# Patient Record
Sex: Male | Born: 1980 | Race: White | Hispanic: No | Marital: Single | Smoking: Former smoker
Health system: Southern US, Community
[De-identification: ages and names within clinical notes are randomized; demographics above are authoritative.]

---

## 2007-08-17 ENCOUNTER — Emergency Department: Payer: Self-pay | Admitting: Emergency Medicine

## 2009-03-25 IMAGING — CR RIGHT ANKLE - COMPLETE 3+ VIEW
1 series · 5 of 5 positions shown · non-contrast
Comparison: none

REASON FOR EXAM: Trauma, pain, swelling
COMMENTS:

[Series 1: view not recorded · 0.17mm/px · 5 of 5 slices shown]
[im 1/5]
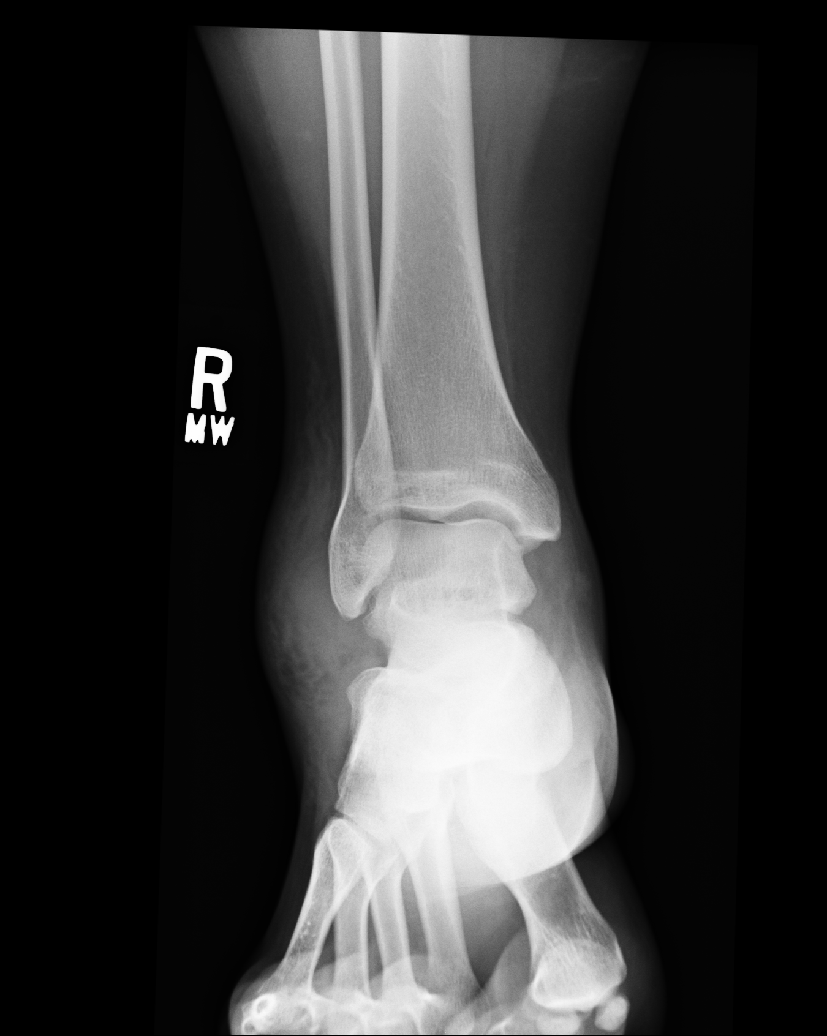
[im 2/5]
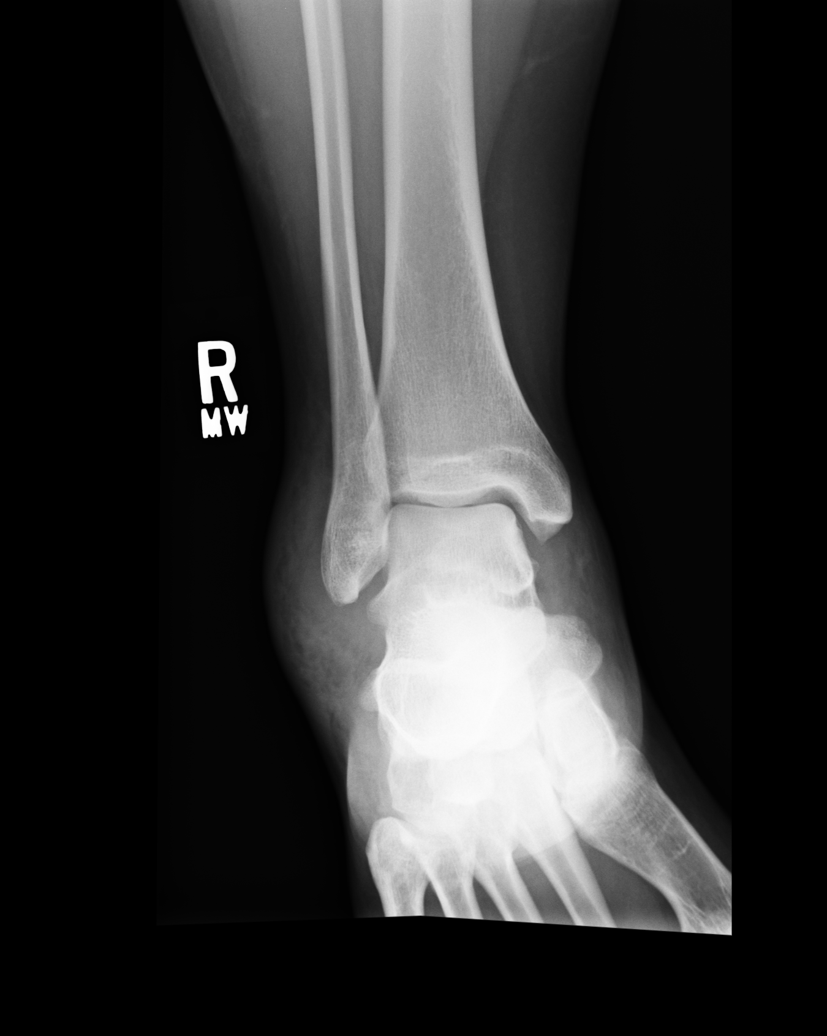
[im 3/5]
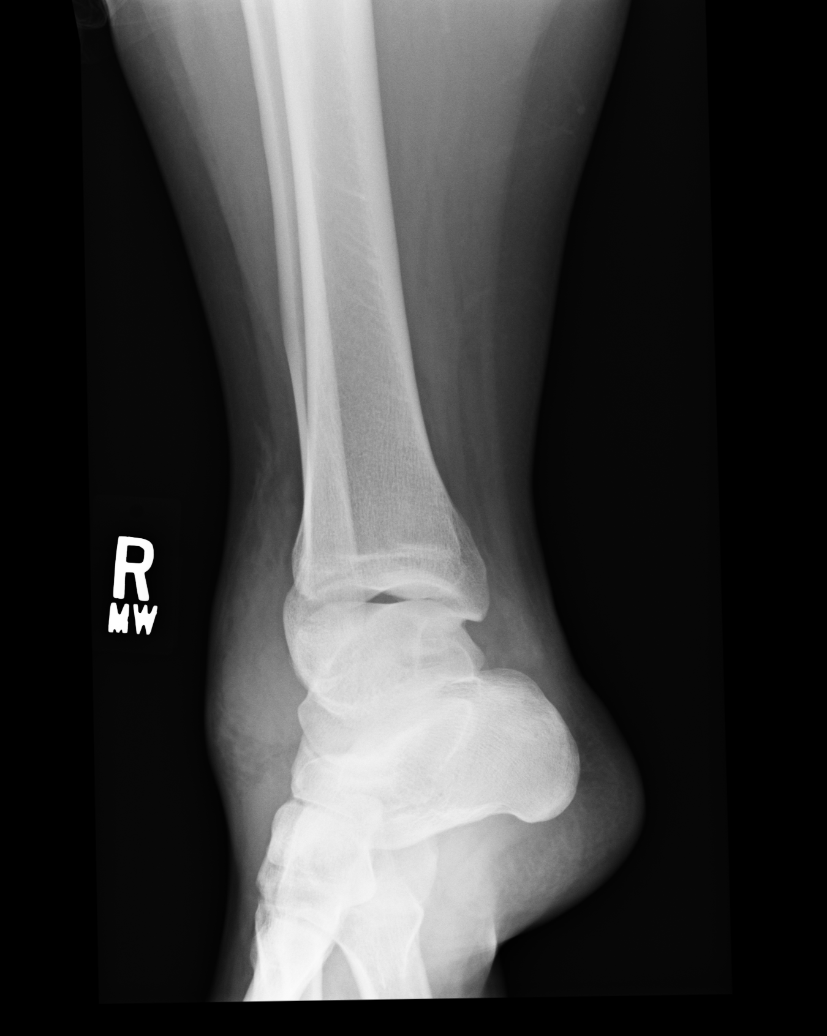
[im 4/5]
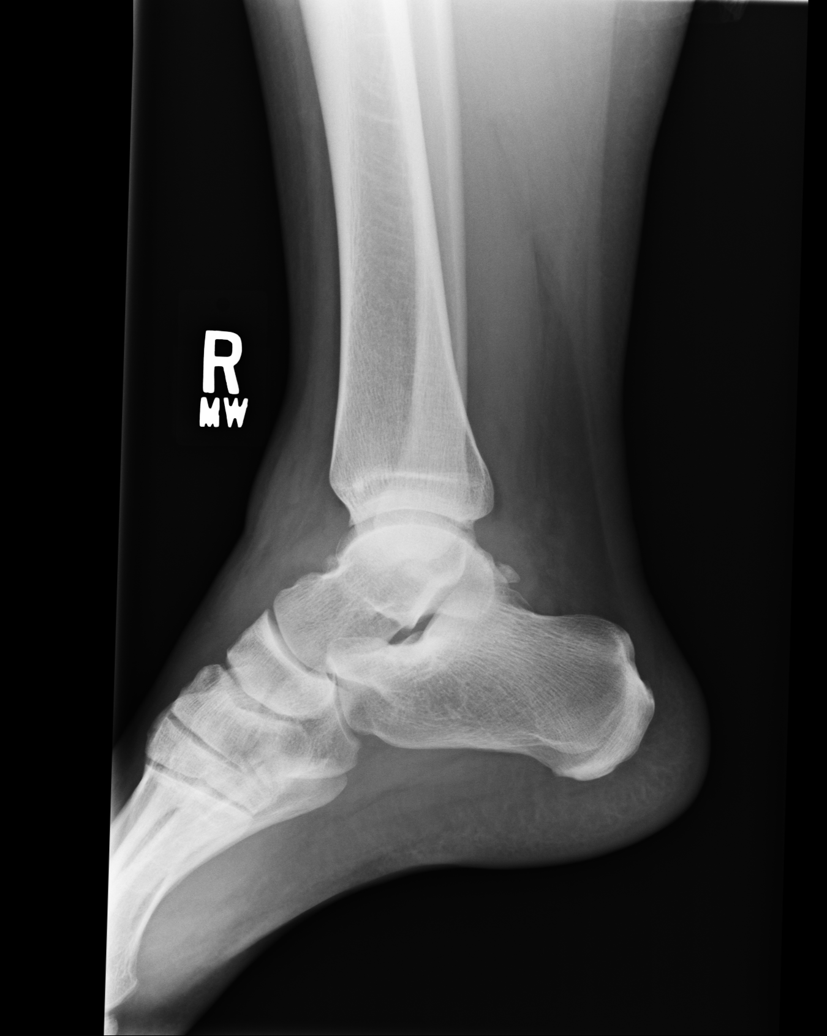
[im 5/5]
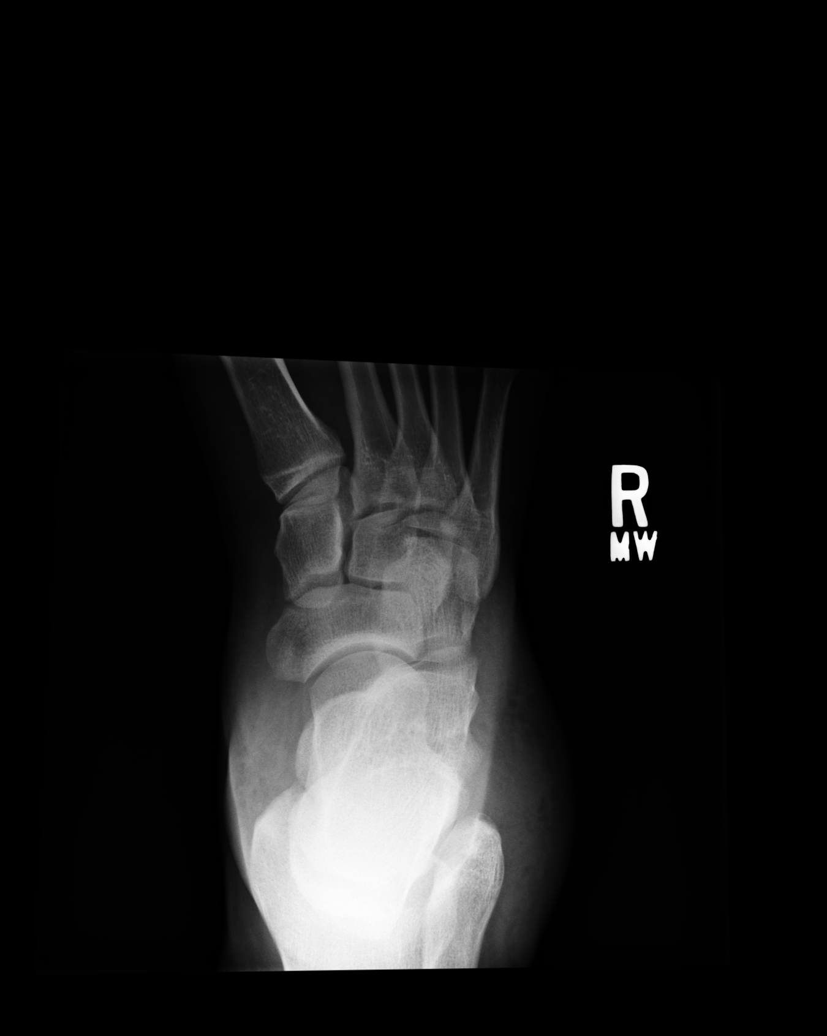

[5 of 5 positions shown; findings below may reference images not displayed]

PROCEDURE:     DXR - DXR ANKLE RIGHT COMPLETE  - August 17, 2007  [DATE]

RESULT:     Soft tissue swelling is appreciated involving the lateral
malleolar region. No radiographic evidence of overt fracture is appreciated.
A radiocult fracture cannot be excluded particularly considering the large
amount of soft tissue swelling.
IMPRESSION: Findings which appear to be consistent with ligamentous
injury within the lateral malleolar region. A radiocult fracture within the
fibula cannot be excluded particularly considering the large amount of soft
tissue swelling. Repeat evaluation in 7-10 days is recommended, if and as
clinically warranted.

## 2012-09-18 ENCOUNTER — Emergency Department: Payer: Self-pay | Admitting: Unknown Physician Specialty

## 2012-09-18 LAB — CBC
HCT: 42.8 % (ref 40.0–52.0)
HGB: 14.6 g/dL (ref 13.0–18.0)
MCH: 28.3 pg (ref 26.0–34.0)
MCV: 83 fL (ref 80–100)
Platelet: 138 10*3/uL — ABNORMAL LOW (ref 150–440)
RBC: 5.17 10*6/uL (ref 4.40–5.90)
RDW: 14.5 % (ref 11.5–14.5)
WBC: 8 10*3/uL (ref 3.8–10.6)

## 2012-09-18 LAB — COMPREHENSIVE METABOLIC PANEL
Anion Gap: 5 — ABNORMAL LOW (ref 7–16)
BUN: 7 mg/dL (ref 7–18)
Chloride: 106 mmol/L (ref 98–107)
Co2: 28 mmol/L (ref 21–32)
Creatinine: 0.83 mg/dL (ref 0.60–1.30)
EGFR (African American): >60
EGFR (Non-African Amer.): >60
Glucose: 92 mg/dL (ref 65–99)
Osmolality: 275 (ref 275–301)
Potassium: 3.8 mmol/L (ref 3.5–5.1)
SGPT (ALT): 125 U/L — ABNORMAL HIGH (ref 12–78)
Total Protein: 7.1 g/dL (ref 6.4–8.2)

## 2012-09-18 LAB — URINALYSIS, COMPLETE
Blood: NEGATIVE
Glucose,UR: NEGATIVE mg/dL (ref 0–75)
Leukocyte Esterase: NEGATIVE
Nitrite: NEGATIVE
Protein: 30
RBC,UR: 1 /HPF (ref 0–5)
Specific Gravity: 1.028 (ref 1.003–1.030)
Squamous Epithelial: NONE SEEN
WBC UR: 2 /HPF (ref 0–5)

## 2012-09-18 LAB — DIFFERENTIAL
Eosinophil #: 0.1 10*3/uL (ref 0.0–0.7)
Eosinophil %: 0.8 %
Lymphocyte #: 3.1 10*3/uL (ref 1.0–3.6)
Lymphocyte %: 38.6 %
Neutrophil %: 51.9 %

## 2012-09-24 LAB — CULTURE, BLOOD (SINGLE)

## 2014-04-27 IMAGING — CR DG CHEST 2V
1 series · 2 of 2 positions shown · non-contrast
Comparison: none

REASON FOR EXAM: cough fever
COMMENTS:   May transport without cardiac monitor

PROCEDURE:     DXR - DXR CHEST PA (OR AP) AND LATERAL  - September 18, 2012  [DATE]
RESULT:     The lungs are clear. The heart and pulmonary vessels are normal.
The bony and mediastinal structures are unremarkable. There is no effusion.
There is no pneumothorax or evidence of congestive failure.

[Series 1: w chest pa · 0.14mm/px · 2 of 2 slices shown]
[im 1/2]
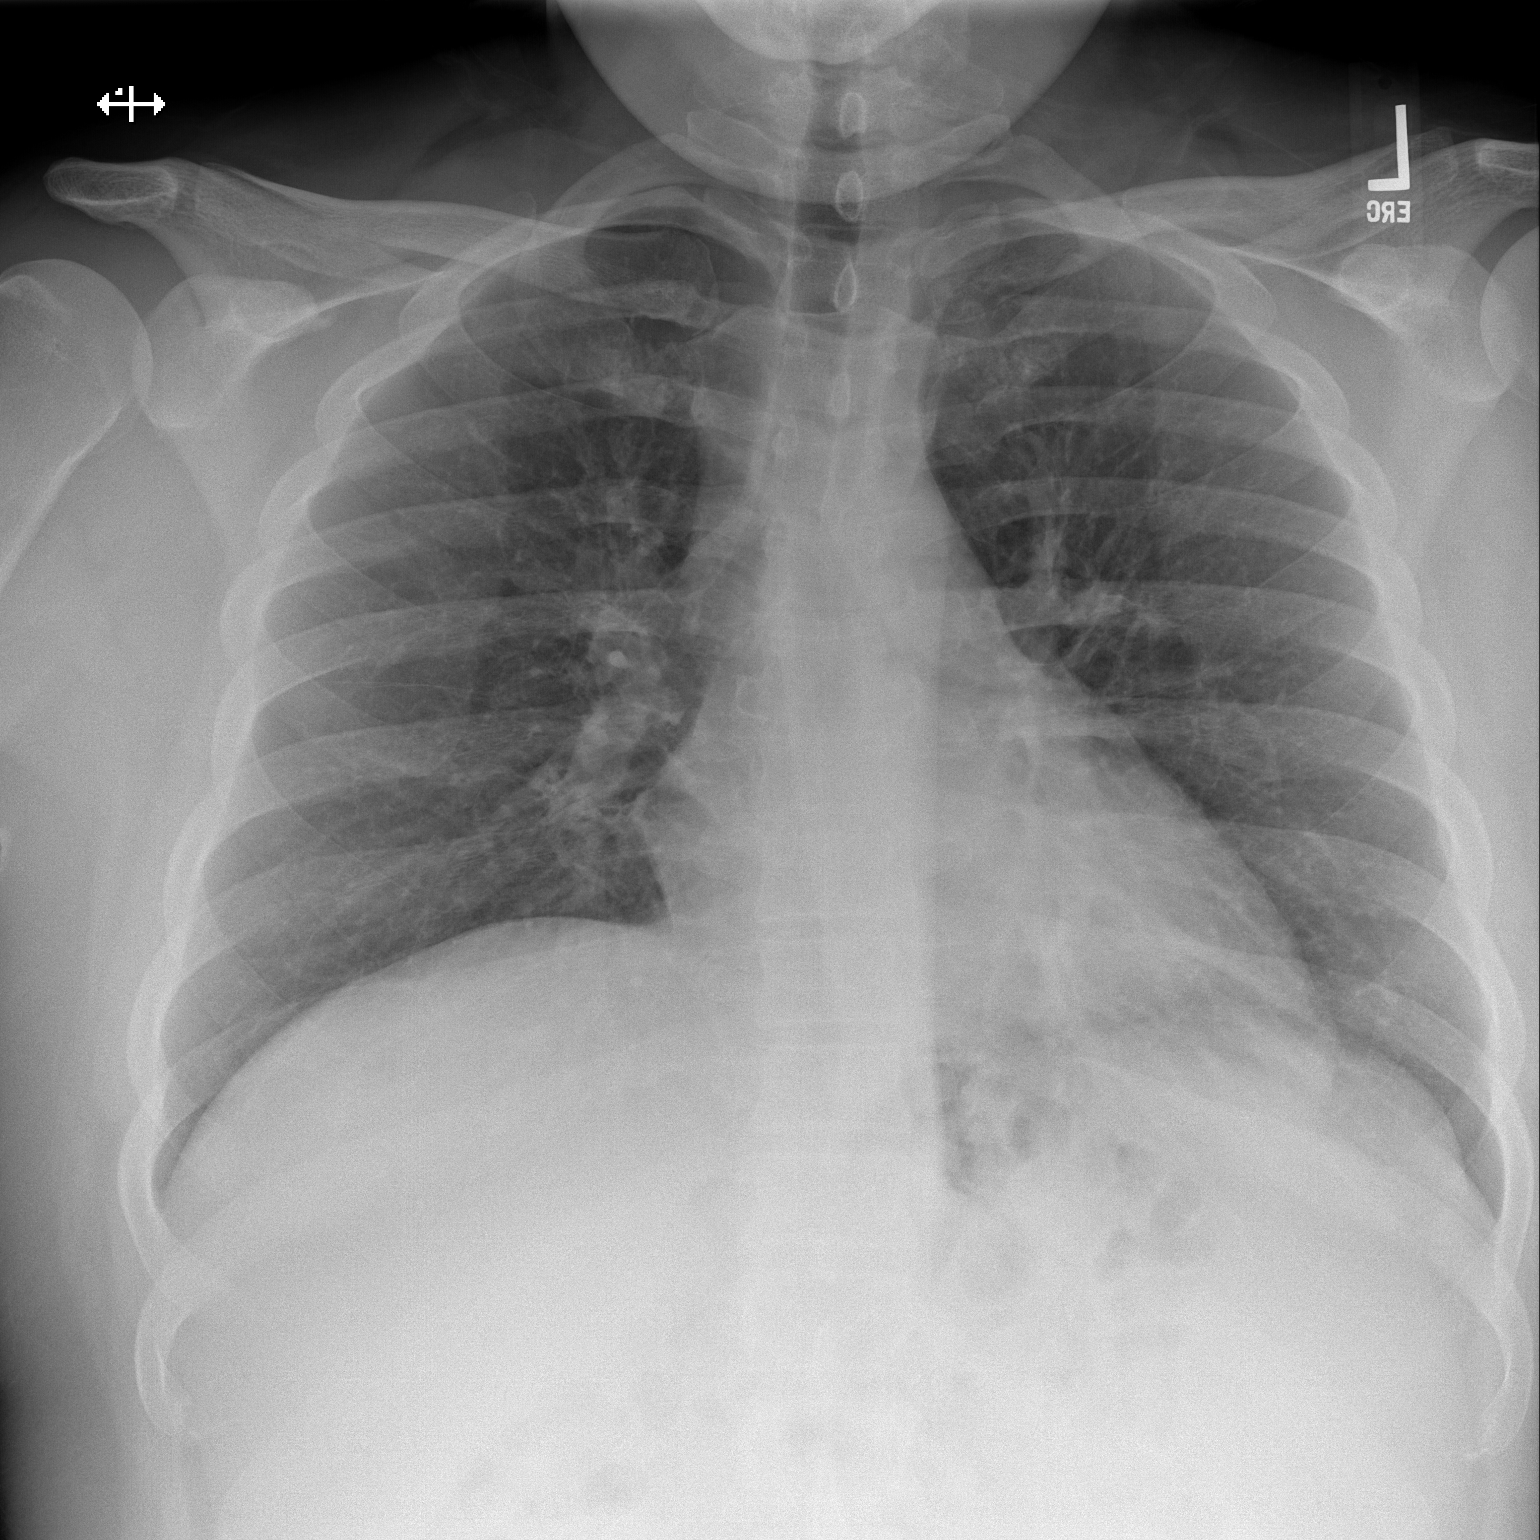
[im 2/2]
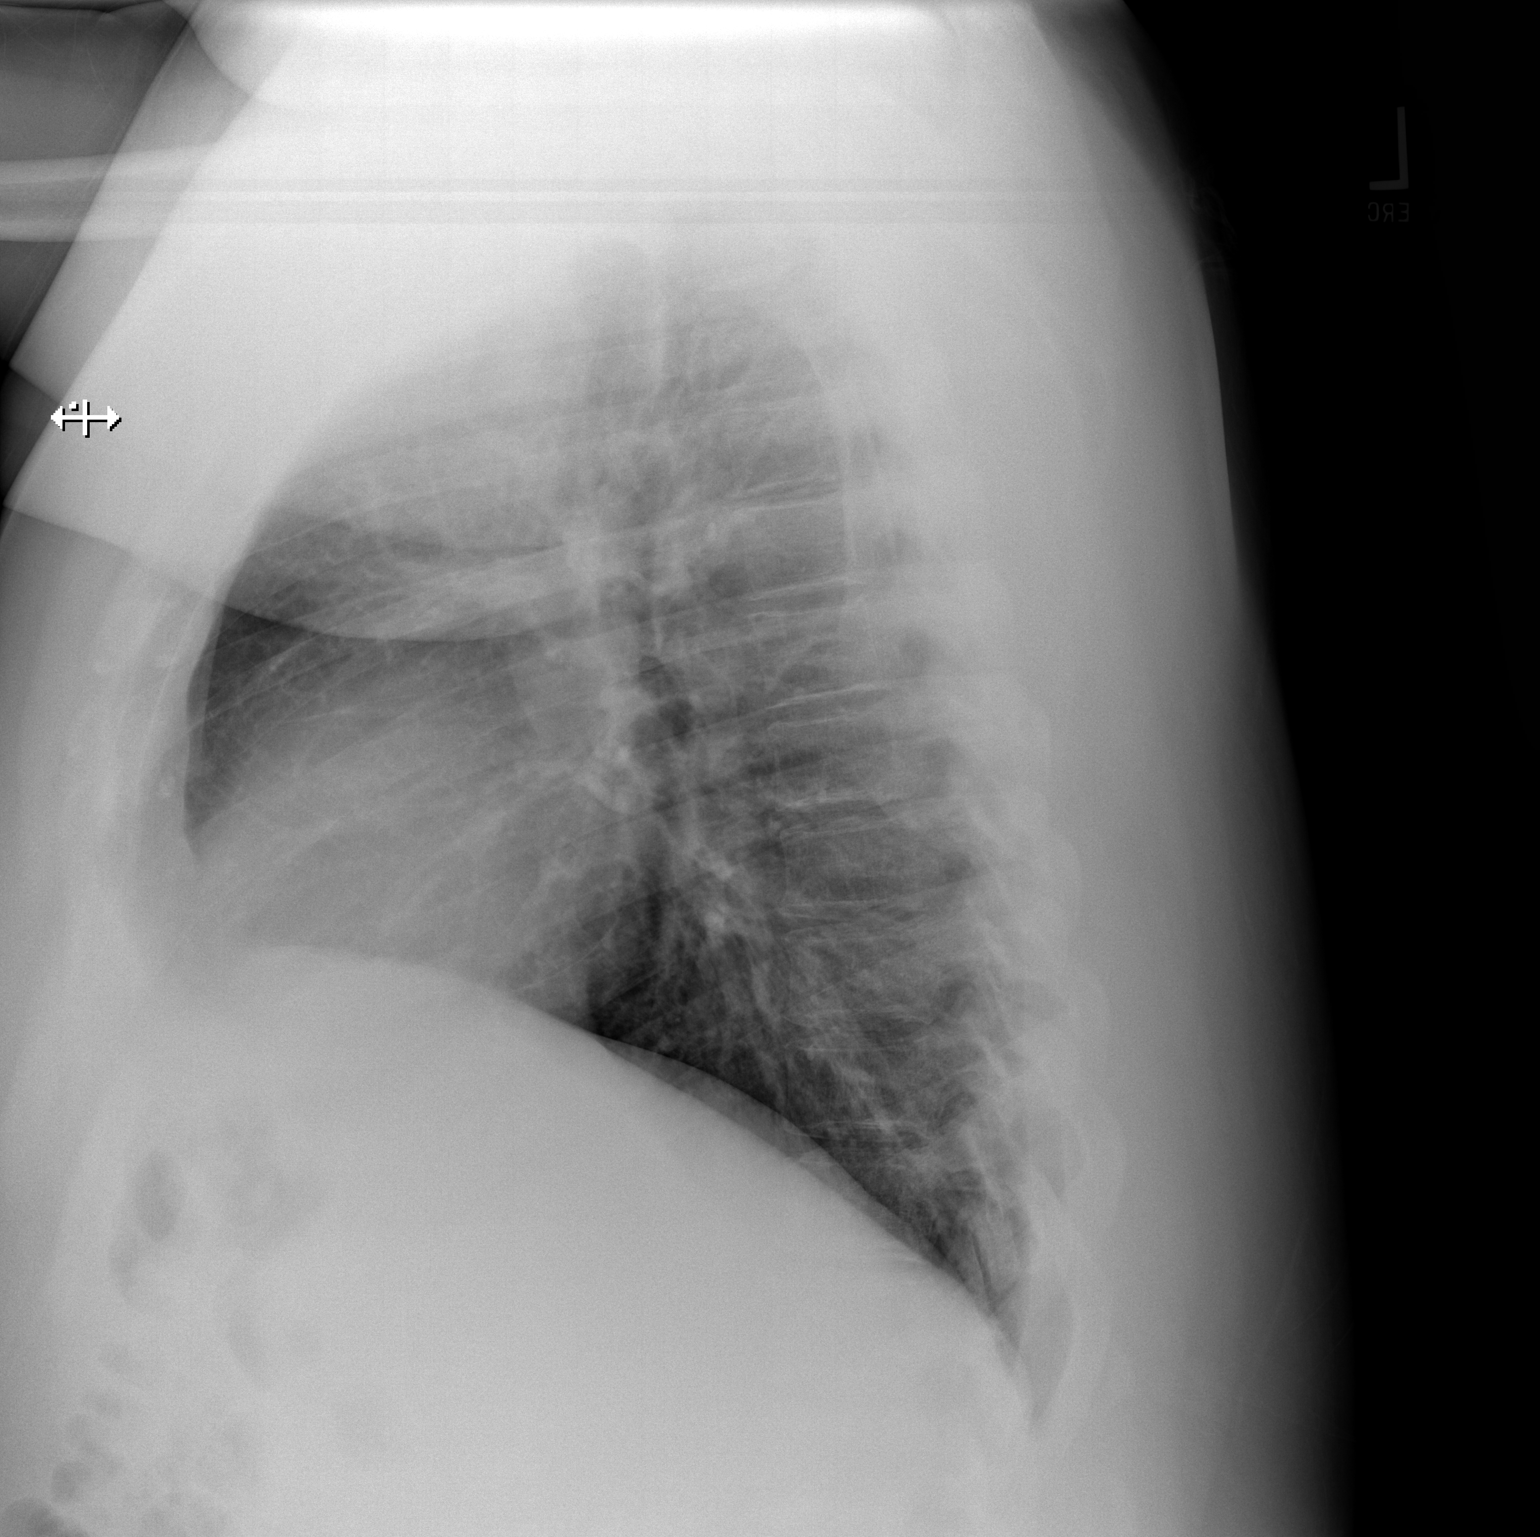

[2 of 2 positions shown; findings below may reference images not displayed]

IMPRESSION: No acute cardiopulmonary disease.

[REDACTED]

## 2019-01-03 ENCOUNTER — Ambulatory Visit (INDEPENDENT_AMBULATORY_CARE_PROVIDER_SITE_OTHER): Payer: BLUE CROSS/BLUE SHIELD | Admitting: Cardiology

## 2019-01-03 ENCOUNTER — Other Ambulatory Visit: Payer: Self-pay

## 2019-01-03 ENCOUNTER — Telehealth: Payer: Self-pay | Admitting: Cardiology

## 2019-01-03 ENCOUNTER — Encounter: Payer: Self-pay | Admitting: Cardiology

## 2019-01-03 VITALS — BP 120/80 | HR 65 | Temp 97.8°F | Ht 66.0 in | Wt 291.0 lb

## 2019-01-03 DIAGNOSIS — R9389 Abnormal findings on diagnostic imaging of other specified body structures: Secondary | ICD-10-CM

## 2019-01-03 DIAGNOSIS — Z6841 Body Mass Index (BMI) 40.0 and over, adult: Secondary | ICD-10-CM

## 2019-01-03 NOTE — Telephone Encounter (Signed)
Written report received from Dr. Carmelina Noun office.  This was reviewed by Dr. Garen Lah. Per MD- a panoramic x-ray was taken.  Per Dr. Carmelina Noun notes- the panorex..."right side shows what appears to be a calcified artery extending below the mandible down the right side of the neck in the submandibular and carotid areas."   Per Dr. Garen Lah, the patient will need to have a carotid US done to confirm findings from the panorex images.  His insurance is currently out of network for our office and the patient should be calling [place] to try to establish. He was also advised to find a PCP as well.  I advised Dr. Garen Lah I will try to find out what the out of pocket cost for a carotid US would be in our office prior to calling the patient and advising him he would need this ordered at some point.

## 2019-01-03 NOTE — Progress Notes (Addendum)
Cardiology Office Note:    Date:  01/03/2019   ID:  Gregory Cisneros, DOB [birth year], MRN [id number]  PCP:  Patient, No Pcp Per  Cardiologist:  Debbe Odea, MD  Electrophysiologist:  None   Referring MD: self referred  Chief Complaint  Patient presents with  . office visit    NEW patient-Patient reports bloackages in left carotid artery shown on xray from dentist    History of Present Illness:    Gregory Cisneros is a 37 y.o. male with no significant past medical history who presents due to being told he has a blockage in a blood vessel in his neck.  Patient states about a week or 2 ago he has been having some tooth ache, which he thought was due to his wisdom teeth.  He went to his dentist and imaging by x-ray was ordered.  Per the patient, the x-ray did show a blockage/hardening in the arteries of his neck on the left side.  He is a Naval architect for about 17 years now.  He is able to do all his work without any problems of dizziness, leg pain, neck pain.  He self-referred himself to [place] and apparently his insurance is not accepted by our institution.  He has decided he will want to follow-up at [facility] clinic due to them accepting his insurance.  He denies any symptoms of headaches, lightheadedness, dizziness, strokes, neck pain, chest pain, blurry vision, shortness of breath, edema.  History reviewed. No pertinent past medical history.  History reviewed. No pertinent surgical history.  Current Medications: No outpatient medications have been marked as taking for the 01/03/19 encounter (Office Visit) with Debbe Odea, MD.     Allergies:   Patient has no known allergies.   Social History   Socioeconomic History  . Marital status: Single    Spouse name: Not on file  . Number of children: Not on file  . Years of education: Not on file  . Highest education level: Not on file  Occupational History  . Not on file  Social Needs  . Financial resource  strain: Not on file  . Food insecurity    Worry: Not on file    Inability: Not on file  . Transportation needs    Medical: Not on file    Non-medical: Not on file  Tobacco Use  . Smoking status: Former Smoker    Types: Cigarettes  . Smokeless tobacco: Current User  Substance and Sexual Activity  . Alcohol use: Yes    Comment: socially  . Drug use: Never  . Sexual activity: Not on file  Lifestyle  . Physical activity    Days per week: Not on file    Minutes per session: Not on file  . Stress: Not on file  Relationships  . Social Musician on phone: Not on file    Gets together: Not on file    Attends religious service: Not on file    Active member of club or organization: Not on file    Attends meetings of clubs or organizations: Not on file    Relationship status: Not on file  Other Topics Concern  . Not on file  Social History Narrative  . Not on file     Family History: The patient's family history includes Heart disease in his maternal grandmother.  ROS:   Please see the history of present illness.     All other systems reviewed and are negative.  EKGs/Labs/Other  Studies Reviewed:    The following studies were reviewed today:   EKG:  EKG is  ordered today.  The ekg ordered today demonstrates normal sinus rhythm, normal EKG.  Recent Labs: No results found for requested labs within last 8760 hours.  Recent Lipid Panel No results found for: CHOL, TRIG, HDL, CHOLHDL, VLDL, LDLCALC, LDLDIRECT  Physical Exam:    VS:  BP 120/80 (BP Location: Right Arm, Patient Position: Sitting, Cuff Size: Large)   Pulse 65   Temp 97.8 F (36.6 C)   Ht 5\' 6"  (1.676 m)   Wt 291 lb (132 kg)   SpO2 96%   BMI 46.97 kg/m     Wt Readings from Last 3 Encounters:  01/03/19 291 lb (132 kg)     GEN:  Well nourished, well developed in no acute distress, looks obese HEENT: Normal NECK: No JVD; No carotid bruits LYMPHATICS: No lymphadenopathy CARDIAC: RRR, no  murmurs, rubs, gallops RESPIRATORY:  Clear to auscultation without rales, wheezing or rhonchi  ABDOMEN: Soft, non-tender, non-distended MUSCULOSKELETAL:  No edema; No deformity  SKIN: Warm and dry NEUROLOGIC:  Alert and oriented x 3 PSYCHIATRIC:  Normal affect   ASSESSMENT:    1. Abnormal finding on imaging   2. Class 3 severe obesity due to excess calories with serious comorbidity and body mass index (BMI) of 40.0 to 44.9 in adult [facility](HCC)    PLAN:    In order of problems listed above:  1. No carotid bruit noted on exam.  No clinical symptoms of carotid stenosis.  Will obtain the report of imaging from his dentist office for review.  Due to insurance issues patient states he will schedule [facility] from a cardiovascular perspective.  He was encouraged to call us if he has any symptoms prior to his appointment with his new cardiovascular provider.  2.  Patient was told to find a primary care provider who will monitos his overall medical condition.  He was counseled on weight gain.   Medication Adjustments/Labs and Tests Ordered: Current medicines are reviewed at length with the patient today.  Concerns regarding medicines are outlined above.  Orders Placed This Encounter  Procedures  . EKG 12-Lead   No orders of the defined types were placed in this encounter.   Patient Instructions  Medication Instructions:  - Your physician recommends that you continue on your current medications as directed. Please refer to the Current Medication list given to you today.  If you need a refill on your cardiac medications before your next appointment, please call your pharmacy.   Lab work: - none ordered  If you have labs (blood work) drawn today and your tests are completely normal, you will receive your results only by: Marland Kitchen. MyChart Message (if you have MyChart) OR . A paper copy in the mail If you have any lab test that is abnormal or we need to change your treatment, we  will call you to review the results.  Testing/Procedures: - none ordered  Follow-Up: At [facility], you and your health needs are our priority.  As part of our continuing mission to provide you with exceptional heart care, we have created designated Provider Care Teams.  These Care Teams include your primary Cardiologist (physician) and Advanced Practice Providers (APPs -  Physician Assistants and Nurse Practitioners) who all work together to provide you with the care you need, when you need it. Marland Kitchen. as needed  Any Other Special Instructions Will Be Listed Below (If Applicable). -  Try to establish with a Primary Care Doctor  - We will obtain X-rayx from your dental office to be reviewed.     Addendum I received a note from the patient's dental office indicating he had a panoramic x-ray of his neck showing some calcification at the angle of the mandible.  This possibly is secondary to carotid artery calcifications.  We will contact patient to let him know of these findings.  We will recommend carotid ultrasound.  Since patient plans to be seen at another clinic for cardiovascular service it will be best for him to have the ultrasound ordered via the new provider due to insurance purposes.  If the wait is too long for his appointment, and if he is is acceptable to paying out-of-pocket we will try to schedule carotid ultrasound via our clinic.   Signed, Kate Sable, MD  01/03/2019 12:20 PM    [facility]

## 2019-01-03 NOTE — Telephone Encounter (Signed)
Patient returned the call, informed patient we were out of network for his insurance. He states he will call [facility] and get them to schedule/order his carotid u/s

## 2019-01-03 NOTE — Telephone Encounter (Signed)
The patient was seen in clinic today by Dr. Garen Lah.   Per MD request, we need to obtain the x-ray report from the patient's dental office where they advised the patient of a possible plaque build up.  I attempted to call Dr. Elta Guadeloupe Kemp's office at [phone] to obtain a fax # to send the patient's signed release to. I left a message for the office to please call back to five a fax #, or if possible to fax the patient's x-ray report to [place] for Dr. Garen Lah to review.

## 2019-01-03 NOTE — Patient Instructions (Signed)
Medication Instructions:  - Your physician recommends that you continue on your current medications as directed. Please refer to the Current Medication list given to you today.  If you need a refill on your cardiac medications before your next appointment, please call your pharmacy.   Lab work: - none ordered  If you have labs (blood work) drawn today and your tests are completely normal, you will receive your results only by: Marland Kitchen MyChart Message (if you have MyChart) OR . A paper copy in the mail If you have any lab test that is abnormal or we need to change your treatment, we will call you to review the results.  Testing/Procedures: - none ordered  Follow-Up: At [facility], you and your health needs are our priority.  As part of our continuing mission to provide you with exceptional heart care, we have created designated Provider Care Teams.  These Care Teams include your primary Cardiologist (physician) and Advanced Practice Providers (APPs -  Physician Assistants and Nurse Practitioners) who all work together to provide you with the care you need, when you need it. Marland Kitchen as needed  Any Other Special Instructions Will Be Listed Below (If Applicable). - Try to establish with a Primary Care Doctor  - We will obtain X-rayx from your dental office to be reviewed.

## 2019-01-03 NOTE — Telephone Encounter (Signed)
I attempted to call the patient. I left a detailed message on his identified voice mail that Dr. Garen Lah reviewed the report from Dr. Carmelina Noun office and that a formal carotid US is advised. I stated that we could order this for him, but due to his insurance, he would probably be paying out of pocket and the cost of the Carotid US is $555.00. I advised he could follow up with [facility] and have them do this, but it should get done at some point.   I asked that he call back to confirm he got this message and just let us know if he will est with [facility] or if he would like [place] to order a carotid US for him.

## 2019-01-03 NOTE — Telephone Encounter (Signed)
Dental office returning phone call. States they do not have x-ray report but can email it. The fax number is [phone] for any further need.

## 2023-07-21 ENCOUNTER — Ambulatory Visit: Admitting: Student
